# Patient Record
Sex: Female | Born: 1995
Health system: Southern US, Community
[De-identification: ages and names within clinical notes are randomized; demographics above are authoritative.]

## PROBLEM LIST (undated history)

## (undated) DIAGNOSIS — M352 Behcet's disease: Secondary | ICD-10-CM

## (undated) HISTORY — DX: Behcet's disease: M35.2

---
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
## 2017-03-23 DIAGNOSIS — Z309 Encounter for contraceptive management, unspecified: Secondary | ICD-10-CM | POA: Diagnosis not present

## 2017-03-23 DIAGNOSIS — Z68.41 Body mass index (BMI) pediatric, greater than or equal to 95th percentile for age: Secondary | ICD-10-CM | POA: Diagnosis not present

## 2017-03-23 DIAGNOSIS — Z111 Encounter for screening for respiratory tuberculosis: Secondary | ICD-10-CM | POA: Diagnosis not present

## 2017-03-24 MED FILL — NORETHIN-ESTRAD-FERR 1-0.02: 1-20 | 28 days supply | Qty: 28 | Fill #0

## 2017-04-19 ENCOUNTER — Ambulatory Visit: Payer: Medicaid Other | Admitting: Advanced Practice Midwife

## 2017-04-27 ENCOUNTER — Encounter (INDEPENDENT_AMBULATORY_CARE_PROVIDER_SITE_OTHER): Payer: Self-pay

## 2017-04-27 ENCOUNTER — Ambulatory Visit (INDEPENDENT_AMBULATORY_CARE_PROVIDER_SITE_OTHER): Payer: 59 | Admitting: Adult Health

## 2017-04-27 ENCOUNTER — Encounter: Payer: Self-pay | Admitting: Adult Health

## 2017-04-27 VITALS — BP 102/66 | HR 84 | Ht 61.0 in | Wt 187.0 lb

## 2017-04-27 DIAGNOSIS — Z30017 Encounter for initial prescription of implantable subdermal contraceptive: Secondary | ICD-10-CM

## 2017-04-27 NOTE — Progress Notes (Signed)
Subjective:     Patient ID: Monica Murphy, female   DOB: [birth year], 21 y.o.   MRN: [id number]  HPI Monica Murphy is a 21 year old black female in to discuss birth control options, is going to [place] in the fall and does not want to have to take a pill. Last sex about 2-3 weeks ago.  PCP is [facility].  Review of Systems  +sexually active, no complaints   Reviewed past medical,surgical, social and family history. Reviewed medications and allergies.     Objective:   Physical Exam BP 102/66 (BP Location: Right Arm, Patient Position: Sitting, Cuff Size: Normal)   Pulse 84   Ht 5\' 1"  (1.549 m)   Wt 187 lb (84.8 kg)   LMP 04/19/2017 (Approximate)   BMI 35.33 kg/m  Skin warm and dry. Neck: mid line trachea, normal thyroid, good ROM, no lymphadenopathy noted. Lungs: clear to ausculation bilaterally. Cardiovascular: regular rate and rhythm.   PHQ 2 score 0. Discussed options such as depo, ring, IUD and nexplanon and she wants nexplanon.  Assessment:     1. Encounter for initial prescription of implantable subdermal contraceptive       Plan:    No sex Return 8[facility]7 [facility] and then pm for nexplanon insertion

## 2017-05-02 ENCOUNTER — Ambulatory Visit (INDEPENDENT_AMBULATORY_CARE_PROVIDER_SITE_OTHER): Payer: 59 | Admitting: Adult Health

## 2017-05-02 ENCOUNTER — Encounter: Payer: Self-pay | Admitting: Adult Health

## 2017-05-02 ENCOUNTER — Other Ambulatory Visit: Payer: 59

## 2017-05-02 VITALS — BP 120/60 | HR 92 | Ht 61.0 in | Wt 188.4 lb

## 2017-05-02 DIAGNOSIS — Z30017 Encounter for initial prescription of implantable subdermal contraceptive: Secondary | ICD-10-CM

## 2017-05-02 DIAGNOSIS — Z3046 Encounter for surveillance of implantable subdermal contraceptive: Secondary | ICD-10-CM

## 2017-05-02 LAB — BETA HCG QUANT (REF LAB): hCG Quant: <1 m[IU]/mL

## 2017-05-02 MED ORDER — ETONOGESTREL 68 MG ~~LOC~~ IMPL
68.0000 mg | DRUG_IMPLANT | Freq: Once | SUBCUTANEOUS | Status: AC
  Administered 2017-05-02: 68 mg via SUBCUTANEOUS

## 2017-05-02 NOTE — Patient Instructions (Signed)
Use condoms x 2 weeks, keep clean and dry x 24 hours, no heavy lifting, keep steri strips on x 72 hours, Keep pressure dressing on x 24 hours. Follow up prn problems.  

## 2017-05-02 NOTE — Progress Notes (Signed)
Subjective:     Patient ID: Monica Murphy, female   DOB: May 19, 1996, 21 y.o.   MRN: [id number]  HPI Monica Murphy is a 21 year old black female in for nexplanon insertion.   Review of Systems For nexplanon insertion Reviewed past medical,surgical, social and family history. Reviewed medications and allergies.     Objective:   Physical Exam BP 120/60 (BP Location: Left Arm, Patient Position: Sitting, Cuff Size: Small)   Pulse 92   Ht 5\' 1"  (1.549 m)   Wt 188 lb 6.4 oz (85.5 kg)   LMP 04/19/2017 (Approximate)   BMI 35.60 kg/m QHCG <1 this am. Consent signed, time out called. Right arm cleansed with betadine, and injected with 1.5 cc 1% lidocaine and waited til numb. Nexplanon easily inserted and steri strips applied.Rod easily palpated by provider and pt. Pressure dressing applied.    Assessment:     Nexplanon insertion lot [id number] exp 1/21    Plan:     Use condoms x 2 weeks, keep clean and dry x 24 hours, no heavy lifting, keep steri strips on x 72 hours, Keep pressure dressing on x 24 hours. Follow up prn problems. Remove in 3 years

## 2017-05-02 NOTE — Addendum Note (Signed)
Addended by: Federico FlakeNES, PEGGY A on: 05/02/2017 05:06 PM   Modules accepted: Orders

## 2017-08-09 DIAGNOSIS — Z3202 Encounter for pregnancy test, result negative: Secondary | ICD-10-CM | POA: Diagnosis not present

## 2017-08-09 DIAGNOSIS — N760 Acute vaginitis: Secondary | ICD-10-CM | POA: Diagnosis not present

## 2017-08-09 DIAGNOSIS — N390 Urinary tract infection, site not specified: Secondary | ICD-10-CM | POA: Diagnosis not present

## 2018-05-08 ENCOUNTER — Encounter: Payer: Self-pay | Admitting: Advanced Practice Midwife

## 2018-05-08 ENCOUNTER — Encounter (INDEPENDENT_AMBULATORY_CARE_PROVIDER_SITE_OTHER): Payer: Self-pay

## 2018-05-08 ENCOUNTER — Other Ambulatory Visit: Payer: Self-pay

## 2018-05-08 ENCOUNTER — Ambulatory Visit (INDEPENDENT_AMBULATORY_CARE_PROVIDER_SITE_OTHER): Payer: 59 | Admitting: Advanced Practice Midwife

## 2018-05-08 VITALS — BP 123/80 | HR 85 | Ht 61.0 in | Wt 200.0 lb

## 2018-05-08 DIAGNOSIS — Z978 Presence of other specified devices: Secondary | ICD-10-CM | POA: Diagnosis not present

## 2018-05-08 DIAGNOSIS — N921 Excessive and frequent menstruation with irregular cycle: Secondary | ICD-10-CM

## 2018-05-08 DIAGNOSIS — Z975 Presence of (intrauterine) contraceptive device: Principal | ICD-10-CM

## 2018-05-08 MED ORDER — MEGESTROL ACETATE 40 MG PO TABS: ORAL_TABLET | ORAL | 3 refills | Status: DC

## 2018-05-08 MED FILL — MEGESTROL 40 MG TABLET: 40 | 45 days supply | Qty: 60 | Fill #0

## 2018-05-08 NOTE — Progress Notes (Signed)
[facility]  Patient name: Monica Fullinguset J Bleicher MRN [id number]  Date of birth: 08/07/1996  CC & HPI:  Monica Murphy is a 22 y.o.  female presenting today for BTB on Nexplanon. Got it placed 1 year ago, has had off and on bleeding since.   Pertinent History Reviewed:  Medical & Surgical Hx:   Past Medical History:  Diagnosis Date  . Behcet's syndrome (HCC) 2010   History reviewed. No pertinent surgical history. Family History  Problem Relation Age of Onset  . Anemia Mother     Current Outpatient Medications:  .  etonogestrel (NEXPLANON) 68 MG IMPL implant, 1 each by Subdermal route once., Disp: , Rfl:  .  megestrol (MEGACE) 40 MG tablet, Take 3/day (at the same time) for 5 days; 2/day for 5 days, then 1/day PO prn bleeding, Disp: 60 tablet, Rfl: 3 Social History: Reviewed -  reports that she has never smoked. She has never used smokeless tobacco.  Review of Systems:   Constitutional: Negative for fever and chills Eyes: Negative for visual disturbances Respiratory: Negative for shortness of breath, dyspnea Cardiovascular: Negative for chest pain or palpitations  Gastrointestinal: Negative for vomiting, diarrhea and constipation; no abdominal pain Genitourinary: Negative for dysuria and urgency, vaginal irritation or itching Musculoskeletal: Negative for back pain, joint pain, myalgias  Neurological: Negative for dizziness and headaches    Objective Findings:    Physical Examination: Vitals:   05/08/18 0936  BP: 123/80  Pulse: 85   General appearance - well appearing, and in no distress Mental status - alert, oriented to person, place, and time Chest:  Normal respiratory effort Heart - normal rate and regular rhythm Abdomen:  Soft, nontender Pelvic: deferred Musculoskeletal:  Normal range of motion without pain Extremities:  No edema    No results found for this or any previous visit (from the past 24 hour(s)).    Assessment & Plan:  A:   BTB  on nex planon P:  megace algorhithm  Return for If you have any problems.  Jacklyn ShellFrances Cresenzo-Dishmon CNM 05/08/2018 12:46 PM

## 2018-09-06 ENCOUNTER — Ambulatory Visit: Payer: 59 | Admitting: Adult Health

## 2019-02-14 DIAGNOSIS — Z113 Encounter for screening for infections with a predominantly sexual mode of transmission: Secondary | ICD-10-CM | POA: Diagnosis not present

## 2019-02-14 DIAGNOSIS — Z114 Encounter for screening for human immunodeficiency virus [HIV]: Secondary | ICD-10-CM | POA: Diagnosis not present

## 2019-06-13 ENCOUNTER — Other Ambulatory Visit: Payer: Self-pay | Admitting: Advanced Practice Midwife

## 2019-06-14 ENCOUNTER — Other Ambulatory Visit: Payer: Self-pay | Admitting: Advanced Practice Midwife

## 2019-06-14 ENCOUNTER — Other Ambulatory Visit: Payer: Self-pay | Admitting: *Deleted

## 2019-06-14 MED ORDER — MEGESTROL ACETATE 40 MG PO TABS: ORAL_TABLET | ORAL | 0 refills | Status: AC

## 2019-06-14 MED FILL — MEGESTROL 40 MG TABLET: 40 | 45 days supply | Qty: 60 | Fill #0

## 2020-05-04 ENCOUNTER — Encounter: Payer: Self-pay | Admitting: Advanced Practice Midwife

## 2020-07-09 DIAGNOSIS — Z03818 Encounter for observation for suspected exposure to other biological agents ruled out: Secondary | ICD-10-CM | POA: Diagnosis not present

## 2020-09-23 DIAGNOSIS — Z20822 Contact with and (suspected) exposure to covid-19: Secondary | ICD-10-CM | POA: Diagnosis not present

## 2022-12-27 DIAGNOSIS — Z202 Contact with and (suspected) exposure to infections with a predominantly sexual mode of transmission: Secondary | ICD-10-CM | POA: Diagnosis not present

## 2022-12-27 DIAGNOSIS — F331 Major depressive disorder, recurrent, moderate: Secondary | ICD-10-CM | POA: Diagnosis not present

## 2022-12-27 DIAGNOSIS — Z1389 Encounter for screening for other disorder: Secondary | ICD-10-CM | POA: Diagnosis not present

## 2022-12-27 DIAGNOSIS — Z113 Encounter for screening for infections with a predominantly sexual mode of transmission: Secondary | ICD-10-CM | POA: Diagnosis not present

## 2023-01-11 DIAGNOSIS — Z3049 Encounter for surveillance of other contraceptives: Secondary | ICD-10-CM | POA: Diagnosis not present

## 2023-05-03 DIAGNOSIS — Z975 Presence of (intrauterine) contraceptive device: Secondary | ICD-10-CM | POA: Diagnosis not present

## 2023-05-03 DIAGNOSIS — N921 Excessive and frequent menstruation with irregular cycle: Secondary | ICD-10-CM | POA: Diagnosis not present

## 2023-11-08 DIAGNOSIS — F4323 Adjustment disorder with mixed anxiety and depressed mood: Secondary | ICD-10-CM | POA: Diagnosis not present

## 2023-11-13 DIAGNOSIS — F4323 Adjustment disorder with mixed anxiety and depressed mood: Secondary | ICD-10-CM | POA: Diagnosis not present

## 2023-11-20 DIAGNOSIS — F4323 Adjustment disorder with mixed anxiety and depressed mood: Secondary | ICD-10-CM | POA: Diagnosis not present

## 2023-11-27 DIAGNOSIS — F4323 Adjustment disorder with mixed anxiety and depressed mood: Secondary | ICD-10-CM | POA: Diagnosis not present

## 2023-12-18 DIAGNOSIS — F4323 Adjustment disorder with mixed anxiety and depressed mood: Secondary | ICD-10-CM | POA: Diagnosis not present

## 2023-12-25 DIAGNOSIS — F4323 Adjustment disorder with mixed anxiety and depressed mood: Secondary | ICD-10-CM | POA: Diagnosis not present

## 2024-01-08 DIAGNOSIS — F4323 Adjustment disorder with mixed anxiety and depressed mood: Secondary | ICD-10-CM | POA: Diagnosis not present

## 2024-01-24 DIAGNOSIS — R5383 Other fatigue: Secondary | ICD-10-CM | POA: Diagnosis not present

## 2024-01-24 DIAGNOSIS — Z113 Encounter for screening for infections with a predominantly sexual mode of transmission: Secondary | ICD-10-CM | POA: Diagnosis not present

## 2024-01-24 DIAGNOSIS — H1011 Acute atopic conjunctivitis, right eye: Secondary | ICD-10-CM | POA: Diagnosis not present

## 2024-01-24 DIAGNOSIS — Z1389 Encounter for screening for other disorder: Secondary | ICD-10-CM | POA: Diagnosis not present

## 2024-03-04 DIAGNOSIS — F4323 Adjustment disorder with mixed anxiety and depressed mood: Secondary | ICD-10-CM | POA: Diagnosis not present

## 2024-03-25 DIAGNOSIS — F4323 Adjustment disorder with mixed anxiety and depressed mood: Secondary | ICD-10-CM | POA: Diagnosis not present

## 2024-03-27 DIAGNOSIS — Z23 Encounter for immunization: Secondary | ICD-10-CM | POA: Diagnosis not present

## 2024-04-08 DIAGNOSIS — F4323 Adjustment disorder with mixed anxiety and depressed mood: Secondary | ICD-10-CM | POA: Diagnosis not present

## 2024-04-22 DIAGNOSIS — F4323 Adjustment disorder with mixed anxiety and depressed mood: Secondary | ICD-10-CM | POA: Diagnosis not present

## 2024-04-24 DIAGNOSIS — R5383 Other fatigue: Secondary | ICD-10-CM | POA: Diagnosis not present

## 2024-04-24 DIAGNOSIS — Z13220 Encounter for screening for lipoid disorders: Secondary | ICD-10-CM | POA: Diagnosis not present

## 2024-04-24 DIAGNOSIS — F419 Anxiety disorder, unspecified: Secondary | ICD-10-CM | POA: Diagnosis not present

## 2024-04-24 DIAGNOSIS — Z114 Encounter for screening for human immunodeficiency virus [HIV]: Secondary | ICD-10-CM | POA: Diagnosis not present

## 2024-04-24 DIAGNOSIS — Z1159 Encounter for screening for other viral diseases: Secondary | ICD-10-CM | POA: Diagnosis not present

## 2024-04-24 DIAGNOSIS — Z131 Encounter for screening for diabetes mellitus: Secondary | ICD-10-CM | POA: Diagnosis not present

## 2024-04-24 DIAGNOSIS — Z130 Encounter for screening for diseases of the blood and blood-forming organs and certain disorders involving the immune mechanism: Secondary | ICD-10-CM | POA: Diagnosis not present

## 2024-04-24 DIAGNOSIS — N921 Excessive and frequent menstruation with irregular cycle: Secondary | ICD-10-CM | POA: Diagnosis not present

## 2024-04-24 DIAGNOSIS — F332 Major depressive disorder, recurrent severe without psychotic features: Secondary | ICD-10-CM | POA: Diagnosis not present

## 2024-05-01 DIAGNOSIS — Z0000 Encounter for general adult medical examination without abnormal findings: Secondary | ICD-10-CM | POA: Diagnosis not present

## 2024-05-01 DIAGNOSIS — E559 Vitamin D deficiency, unspecified: Secondary | ICD-10-CM | POA: Diagnosis not present

## 2024-05-01 DIAGNOSIS — F17210 Nicotine dependence, cigarettes, uncomplicated: Secondary | ICD-10-CM | POA: Diagnosis not present

## 2024-05-01 DIAGNOSIS — Z23 Encounter for immunization: Secondary | ICD-10-CM | POA: Diagnosis not present

## 2024-05-01 DIAGNOSIS — Z124 Encounter for screening for malignant neoplasm of cervix: Secondary | ICD-10-CM | POA: Diagnosis not present

## 2024-05-01 DIAGNOSIS — E60 Dietary zinc deficiency: Secondary | ICD-10-CM | POA: Diagnosis not present

## 2024-05-01 DIAGNOSIS — E611 Iron deficiency: Secondary | ICD-10-CM | POA: Diagnosis not present

## 2024-05-01 DIAGNOSIS — Z113 Encounter for screening for infections with a predominantly sexual mode of transmission: Secondary | ICD-10-CM | POA: Diagnosis not present

## 2024-05-29 DIAGNOSIS — F4323 Adjustment disorder with mixed anxiety and depressed mood: Secondary | ICD-10-CM | POA: Diagnosis not present

## 2024-07-17 DIAGNOSIS — F4323 Adjustment disorder with mixed anxiety and depressed mood: Secondary | ICD-10-CM | POA: Diagnosis not present

## 2024-07-31 DIAGNOSIS — R5383 Other fatigue: Secondary | ICD-10-CM | POA: Diagnosis not present

## 2024-07-31 DIAGNOSIS — F332 Major depressive disorder, recurrent severe without psychotic features: Secondary | ICD-10-CM | POA: Diagnosis not present

## 2024-07-31 DIAGNOSIS — E559 Vitamin D deficiency, unspecified: Secondary | ICD-10-CM | POA: Diagnosis not present

## 2024-07-31 DIAGNOSIS — F411 Generalized anxiety disorder: Secondary | ICD-10-CM | POA: Diagnosis not present

## 2024-07-31 DIAGNOSIS — N921 Excessive and frequent menstruation with irregular cycle: Secondary | ICD-10-CM | POA: Diagnosis not present

## 2024-08-14 DIAGNOSIS — F4323 Adjustment disorder with mixed anxiety and depressed mood: Secondary | ICD-10-CM | POA: Diagnosis not present
